# Patient Record
Sex: Female | Born: 1995 | Marital: Single | Smoking: Never smoker
Health system: Southern US, Community
[De-identification: ages and names within clinical notes are randomized; demographics above are authoritative.]

---
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
## 2014-11-04 ENCOUNTER — Emergency Department (HOSPITAL_COMMUNITY)
Admission: EM | Admit: 2014-11-04 | Discharge: 2014-11-04 | Disposition: A | Discharge status: Home / Self Care | Payer: BLUE CROSS/BLUE SHIELD | Attending: Emergency Medicine | Admitting: Emergency Medicine

## 2014-11-04 ENCOUNTER — Encounter (HOSPITAL_COMMUNITY): Payer: Self-pay | Admitting: *Deleted

## 2014-11-04 DIAGNOSIS — Y9289 Other specified places as the place of occurrence of the external cause: Secondary | ICD-10-CM | POA: Diagnosis not present

## 2014-11-04 DIAGNOSIS — Y998 Other external cause status: Secondary | ICD-10-CM | POA: Diagnosis not present

## 2014-11-04 DIAGNOSIS — Y9389 Activity, other specified: Secondary | ICD-10-CM | POA: Insufficient documentation

## 2014-11-04 DIAGNOSIS — W19XXXA Unspecified fall, initial encounter: Secondary | ICD-10-CM

## 2014-11-04 DIAGNOSIS — S01112A Laceration without foreign body of left eyelid and periocular area, initial encounter: Secondary | ICD-10-CM | POA: Diagnosis not present

## 2014-11-04 DIAGNOSIS — W108XXA Fall (on) (from) other stairs and steps, initial encounter: Secondary | ICD-10-CM | POA: Diagnosis not present

## 2014-11-04 DIAGNOSIS — IMO0002 Reserved for concepts with insufficient information to code with codable children: Secondary | ICD-10-CM

## 2014-11-04 MED ORDER — LIDOCAINE HCL (PF) 1 % IJ SOLN
5.0000 mL | Freq: Once | INTRAMUSCULAR | Status: AC
  Administered 2014-11-04: 5 mL via INTRADERMAL
  Filled 2014-11-04: qty 5

## 2014-11-04 NOTE — Discharge Instructions (Signed)
Facial Laceration  A facial laceration is a cut on the face. These injuries can be painful and cause bleeding. Lacerations usually heal quickly, but they need special care to reduce scarring. DIAGNOSIS  Your health care provider will take a medical history, ask for details about how the injury occurred, and examine the wound to determine how deep the cut is. TREATMENT  Some facial lacerations may not require closure. Others may not be able to be closed because of an increased risk of infection. The risk of infection and the chance for successful closure will depend on various factors, including the amount of time since the injury occurred. The wound may be cleaned to help prevent infection. If closure is appropriate, pain medicines may be given if needed. Your health care provider will use stitches (sutures), wound glue (adhesive), or skin adhesive strips to repair the laceration. These tools bring the skin edges together to allow for faster healing and a better cosmetic outcome. If needed, you may also be given a tetanus shot. HOME CARE INSTRUCTIONS  Only take over-the-counter or prescription medicines as directed by your health care provider.  Follow your health care provider's instructions for wound care. These instructions will vary depending on the technique used for closing the wound. For Sutures:  Keep the wound clean and dry.   If you were given a bandage (dressing), you should change it at least once a day. Also change the dressing if it becomes wet or dirty, or as directed by your health care provider.   Wash the wound with soap and water 2 times a day. Rinse the wound off with water to remove all soap. Pat the wound dry with a clean towel.   After cleaning, apply a thin layer of the antibiotic ointment recommended by your health care provider. This will help prevent infection and keep the dressing from sticking.   You may shower as usual after the first 24 hours. Do not soak the  wound in water until the sutures are removed.   Get your sutures removed as directed by your health care provider. With facial lacerations, sutures should usually be taken out after 4-5 days to avoid stitch marks.   Wait a few days after your sutures are removed before applying any makeup. For Skin Adhesive Strips:  Keep the wound clean and dry.   Do not get the skin adhesive strips wet. You may bathe carefully, using caution to keep the wound dry.   If the wound gets wet, pat it dry with a clean towel.   Skin adhesive strips will fall off on their own. You may trim the strips as the wound heals. Do not remove skin adhesive strips that are still stuck to the wound. They will fall off in time.  For Wound Adhesive:  You may briefly wet your wound in the shower or bath. Do not soak or scrub the wound. Do not swim. Avoid periods of heavy sweating until the skin adhesive has fallen off on its own. After showering or bathing, gently pat the wound dry with a clean towel.   Do not apply liquid medicine, cream medicine, ointment medicine, or makeup to your wound while the skin adhesive is in place. This may loosen the film before your wound is healed.   If a dressing is placed over the wound, be careful not to apply tape directly over the skin adhesive. This may cause the adhesive to be pulled off before the wound is healed.   Avoid   prolonged exposure to sunlight or tanning lamps while the skin adhesive is in place.  The skin adhesive will usually remain in place for 5-10 days, then naturally fall off the skin. Do not pick at the adhesive film.  After Healing: Once the wound has healed, cover the wound with sunscreen during the day for 1 full year. This can help minimize scarring. Exposure to ultraviolet light in the first year will darken the scar. It can take 1-2 years for the scar to lose its redness and to heal completely.  SEEK IMMEDIATE MEDICAL CARE IF:  You have redness, pain, or  swelling around the wound.   You see ayellowish-white fluid (pus) coming from the wound.   You have chills or a fever.  MAKE SURE YOU:  Understand these instructions.  Will watch your condition.  Will get help right away if you are not doing well or get worse. Document Released: 07/24/2004 Document Revised: 04/06/2013 Document Reviewed: 01/27/2013 ExitCare Patient Information 2015 [employer]. This information is not intended to replace advice given to you by your health care provider. Make sure you discuss any questions you have with your health care provider.  

## 2014-11-04 NOTE — ED Notes (Signed)
Pt reports tripping and falling down steps today. Denies loc. Has small laceration to left eyebrow, bleeding controlled.

## 2014-11-04 NOTE — ED Provider Notes (Signed)
CSN: [id number]     Arrival date & time 11/04/14  1441 History  This chart was scribed for a non-physician practitioner, Roxy Horsemanobert Tanaysia Bhardwaj, PA-C working with Donnetta HutchingBrian Cook, MD by [place] Peace, ED Scribe. [facility] The patient's care was started at 4:15 PM.    Chief Complaint  Patient presents with  . Fall      Patient is a 19 y.o. female presenting with fall. The history is provided by the patient. No language interpreter was used.  Fall Pertinent negatives include no headaches.    HPI Comments: Selena Padilla is a 19 y.o. female who presents to the Emergency Department complaining of fall that occurred that earlier today where she tripped while wearing heels and fell down 8-10 stairs. Pt hit her head and suffered laceration to her left eyebrow. She goes on to explain that she fell down the first step and then slide down the rest of the way. Bleeding is controlled at this time. No complaints of LOC, dizziness, headaches, or weakness. Pt states she is up to date with Tetanus vaccination.    History reviewed. No pertinent past medical history. History reviewed. No pertinent past surgical history. History reviewed. No pertinent family history. History  Substance Use Topics  . Smoking status: Not on file  . Smokeless tobacco: Not on file  . Alcohol Use: No   OB History    No data available     Review of Systems  Skin: Positive for wound.       Laceration to left eyebrow  Neurological: Negative for dizziness, syncope, weakness and headaches.      Allergies  Review of patient's allergies indicates no known allergies.  Home Medications   Prior to Admission medications   Not on File   BP 112/75 mmHg  Pulse 71  Temp(Src) 98.5 F (36.9 C) (Oral)  Resp 18  Ht 5\' 2"  (1.575 m)  Wt 125 lb (56.7 kg)  BMI 22.86 kg/m2  SpO2 100% Physical Exam  Constitutional: She is oriented to person, place, and time. She appears well-developed and well-nourished. No  distress.  HENT:  Head: Normocephalic and atraumatic.  1 cm laceration over left eyebrow, bleeding controlled, no foreign body  Eyes: Conjunctivae and EOM are normal.  Neck: Neck supple. No tracheal deviation present.  Cardiovascular: Normal rate.   Pulmonary/Chest: Effort normal. No respiratory distress.  Musculoskeletal: Normal range of motion.  Moves all extremities, no bony abnormality, deformity, or tenderness of upper or lower extremities, ambulates appropriately, no CTL spine tenderness, step-off, or deformity  Neurological: She is alert and oriented to person, place, and time.  Skin: Skin is warm and dry.  Psychiatric: She has a normal mood and affect. Her behavior is normal.  Nursing note and vitals reviewed.   ED Course  Procedures (including critical care time) Labs Review Labs Reviewed - No data to display  Imaging Review No results found.   EKG Interpretation None     Medications - No data to display LACERATION REPAIR Performed by: Roxy HorsemanBROWNING, Keymon Mcelroy Authorized by: Roxy HorsemanBROWNING, Makell Cyr Consent: Verbal consent obtained. Risks and benefits: risks, benefits and alternatives were discussed Consent given by: patient Patient identity confirmed: provided demographic data Prepped and Draped in normal sterile fashion Wound explored  Laceration Location: Left Eyebrow  Laceration Length: 1 cm  No Foreign Bodies seen or palpated  Anesthesia: local infiltration  Local anesthetic: lidocaine 1 % without epinephrine  Anesthetic total: 1 ml  Irrigation method: syringe Amount of cleaning: standard  Skin closure: 6-0 proline   Number of sutures: 2   Technique: Interrupted   Patient tolerance: Patient tolerated the procedure well with no immediate complications.  4:18 PM- Treatment plan was discussed with patient who verbalizes understanding and agrees.   MDM   Final diagnoses:  Laceration  Fall, initial encounter    Patient with small eyebrow laceration.   Repaired in ED.  Tdap up to date. I personally performed the services described in this documentation, which was scribed in my presence. The recorded information has been reviewed and is accurate.     Roxy Horsemanobert Leshea Jaggers, PA-C 11/04/14 1656  Donnetta HutchingBrian Cook, MD 11/05/14 [phone]

## 2015-03-02 ENCOUNTER — Emergency Department (HOSPITAL_COMMUNITY)
Admission: EM | Admit: 2015-03-02 | Discharge: 2015-03-02 | Disposition: A | Discharge status: Home / Self Care | Payer: BLUE CROSS/BLUE SHIELD | Attending: Emergency Medicine | Admitting: Emergency Medicine

## 2015-03-02 ENCOUNTER — Encounter (HOSPITAL_COMMUNITY): Payer: Self-pay | Admitting: Emergency Medicine

## 2015-03-02 ENCOUNTER — Emergency Department (HOSPITAL_COMMUNITY): Payer: BLUE CROSS/BLUE SHIELD

## 2015-03-02 DIAGNOSIS — R0789 Other chest pain: Secondary | ICD-10-CM

## 2015-03-02 DIAGNOSIS — R079 Chest pain, unspecified: Secondary | ICD-10-CM | POA: Diagnosis present

## 2015-03-02 LAB — CBC WITH DIFFERENTIAL/PLATELET
Basophils Absolute: 0 10*3/uL (ref 0.0–0.1)
Basophils Relative: 0 % (ref 0–1)
EOS ABS: 0 10*3/uL (ref 0.0–0.7)
EOS PCT: 0 % (ref 0–5)
HCT: 43.5 % (ref 36.0–46.0)
Hemoglobin: 14.4 g/dL (ref 12.0–15.0)
LYMPHS ABS: 2.2 10*3/uL (ref 0.7–4.0)
LYMPHS PCT: 22 % (ref 12–46)
MCH: 31 pg (ref 26.0–34.0)
MCHC: 33.1 g/dL (ref 30.0–36.0)
MCV: 93.8 fL (ref 78.0–100.0)
MONO ABS: 1 10*3/uL (ref 0.1–1.0)
MONOS PCT: 10 % (ref 3–12)
Neutro Abs: 6.9 10*3/uL (ref 1.7–7.7)
Neutrophils Relative %: 68 % (ref 43–77)
PLATELETS: 265 10*3/uL (ref 150–400)
RBC: 4.64 MIL/uL (ref 3.87–5.11)
RDW: 12.6 % (ref 11.5–15.5)
WBC: 10 10*3/uL (ref 4.0–10.5)

## 2015-03-02 LAB — BASIC METABOLIC PANEL
Anion gap: 8 (ref 5–15)
BUN: 11 mg/dL (ref 6–20)
CO2: 25 mmol/L (ref 22–32)
CREATININE: 0.79 mg/dL (ref 0.44–1.00)
Calcium: 9.4 mg/dL (ref 8.9–10.3)
Chloride: 106 mmol/L (ref 101–111)
GFR calc Af Amer: >60 mL/min (ref >60)
GLUCOSE: 84 mg/dL (ref 65–99)
Potassium: 4.2 mmol/L (ref 3.5–5.1)
SODIUM: 139 mmol/L (ref 135–145)

## 2015-03-02 LAB — I-STAT TROPONIN, ED: Troponin i, poc: 0 ng/mL (ref 0.00–0.08)

## 2015-03-02 NOTE — ED Provider Notes (Signed)
CSN: [id number]     Arrival date & time 03/02/15  1739 History   First MD Initiated Contact with Patient 03/02/15 1925     Chief Complaint  Patient presents with  . chest soreness      (Consider location/radiation/quality/duration/timing/severity/associated sxs/prior Treatment) HPI   Pt. is a 19 year old female who complains of chest tightness. States she had cramping in the epigastric region followed by chest tightness after getting a tattoo on her left lateral ribs earlier today. States pain began about 20 minutes following the tattoo. Not currently experiencing any chest pain or abdominal pain. Has not taken any medications for pain. Denies N/V/D, change in bowel habits, SOB, or dysuria.  History reviewed. No pertinent past medical history. History reviewed. No pertinent past surgical history. No family history on file. Social History  Substance Use Topics  . Smoking status: Never Smoker   . Smokeless tobacco: None  . Alcohol Use: No   OB History    No data available     Review of Systems  10 Systems reviewed and are negative for acute change except as noted in the HPI.    Allergies  Review of patient's allergies indicates no known allergies.  Home Medications   Prior to Admission medications   Not on File   BP 141/84 mmHg  Pulse 86  Temp(Src) 98.4 F (36.9 C) (Oral)  Resp 18  SpO2 100%  LMP 03/02/2015 Physical Exam  Constitutional: She appears well-developed and well-nourished. No distress.  HENT:  Head: Normocephalic and atraumatic.  Eyes: Pupils are equal, round, and reactive to light.  Neck: Normal range of motion. Neck supple.  Cardiovascular: Normal rate and regular rhythm.   Pulmonary/Chest: Effort normal and breath sounds normal. She has no wheezes. She has no rhonchi.    Abdominal: Soft.  Neurological: She is alert.  Skin: Skin is warm and dry.  Nursing note and vitals reviewed.   ED Course  Procedures (including critical care time) Labs  Review Labs Reviewed  CBC WITH DIFFERENTIAL/PLATELET  BASIC METABOLIC PANEL  Rosezena Sensor, ED    Imaging Review Dg Chest 2 View  03/02/2015   CLINICAL DATA:  Acute left chest pain today.  EXAM: CHEST  2 VIEW  COMPARISON:  03/04/2012 radiograph  FINDINGS: The cardiomediastinal silhouette is unremarkable.  There is no evidence of focal airspace disease, pulmonary edema, suspicious pulmonary nodule/mass, pleural effusion, or pneumothorax. No acute bony abnormalities are identified.  IMPRESSION: No active cardiopulmonary disease.   Electronically Signed   By: Harmon Pier M.D.   On: 03/02/2015 19:56   I have personally reviewed and evaluated these images and lab results as part of my medical decision-making.   EKG Interpretation None      MDM   Final diagnoses:  Chest wall pain     Patients labs and chest xray are unremarkable. Normal EKG. Patient started having pain 20 min after tattoo on left ribs. She was required to lay on her side for hours. Pain has since resolved. I believe that the symptoms were most likely related to tattoo inflicted by the trauma of a tattoo. No cardiac risk factors or family hx.  Recommend NSAIDS.  Medications - No data to display  19 y.o.Selena Padilla's evaluation in the Emergency Department is complete. It has been determined that no acute conditions requiring further emergency intervention are present at this time. The patient/guardian have been advised of the diagnosis and plan. We have discussed signs and symptoms that warrant return to  the ED, such as changes or worsening in symptoms.  Vital signs are stable at discharge. Filed Vitals:   03/02/15 1824  BP: 141/84  Pulse: 86  Temp: 98.4 F (36.9 C)  Resp: 18    Patient/guardian has voiced understanding and agreed to follow-up with the PCP or specialist.     Marlon Pel, PA-C 03/02/15 2053  Marlon Pel, PA-C 03/02/15 [id number]  Lyndal Pulley, MD 03/03/15 1459

## 2015-03-02 NOTE — Discharge Instructions (Signed)

## 2015-03-02 NOTE — ED Notes (Signed)
Per pt, states she got a tattoo today under left breast/side-states she experienced chest tightness on her left side 20 minutes after getting tattoo

## 2015-03-05 ENCOUNTER — Encounter (HOSPITAL_COMMUNITY): Payer: Self-pay | Admitting: *Deleted

## 2016-01-30 IMAGING — CR DG CHEST 2V
2 series · 2 of 2 positions shown · non-contrast
Comparison: 03/04/2012 radiograph

CLINICAL DATA: Acute left chest pain today.

EXAM:
CHEST  2 VIEW

[w chest pa]
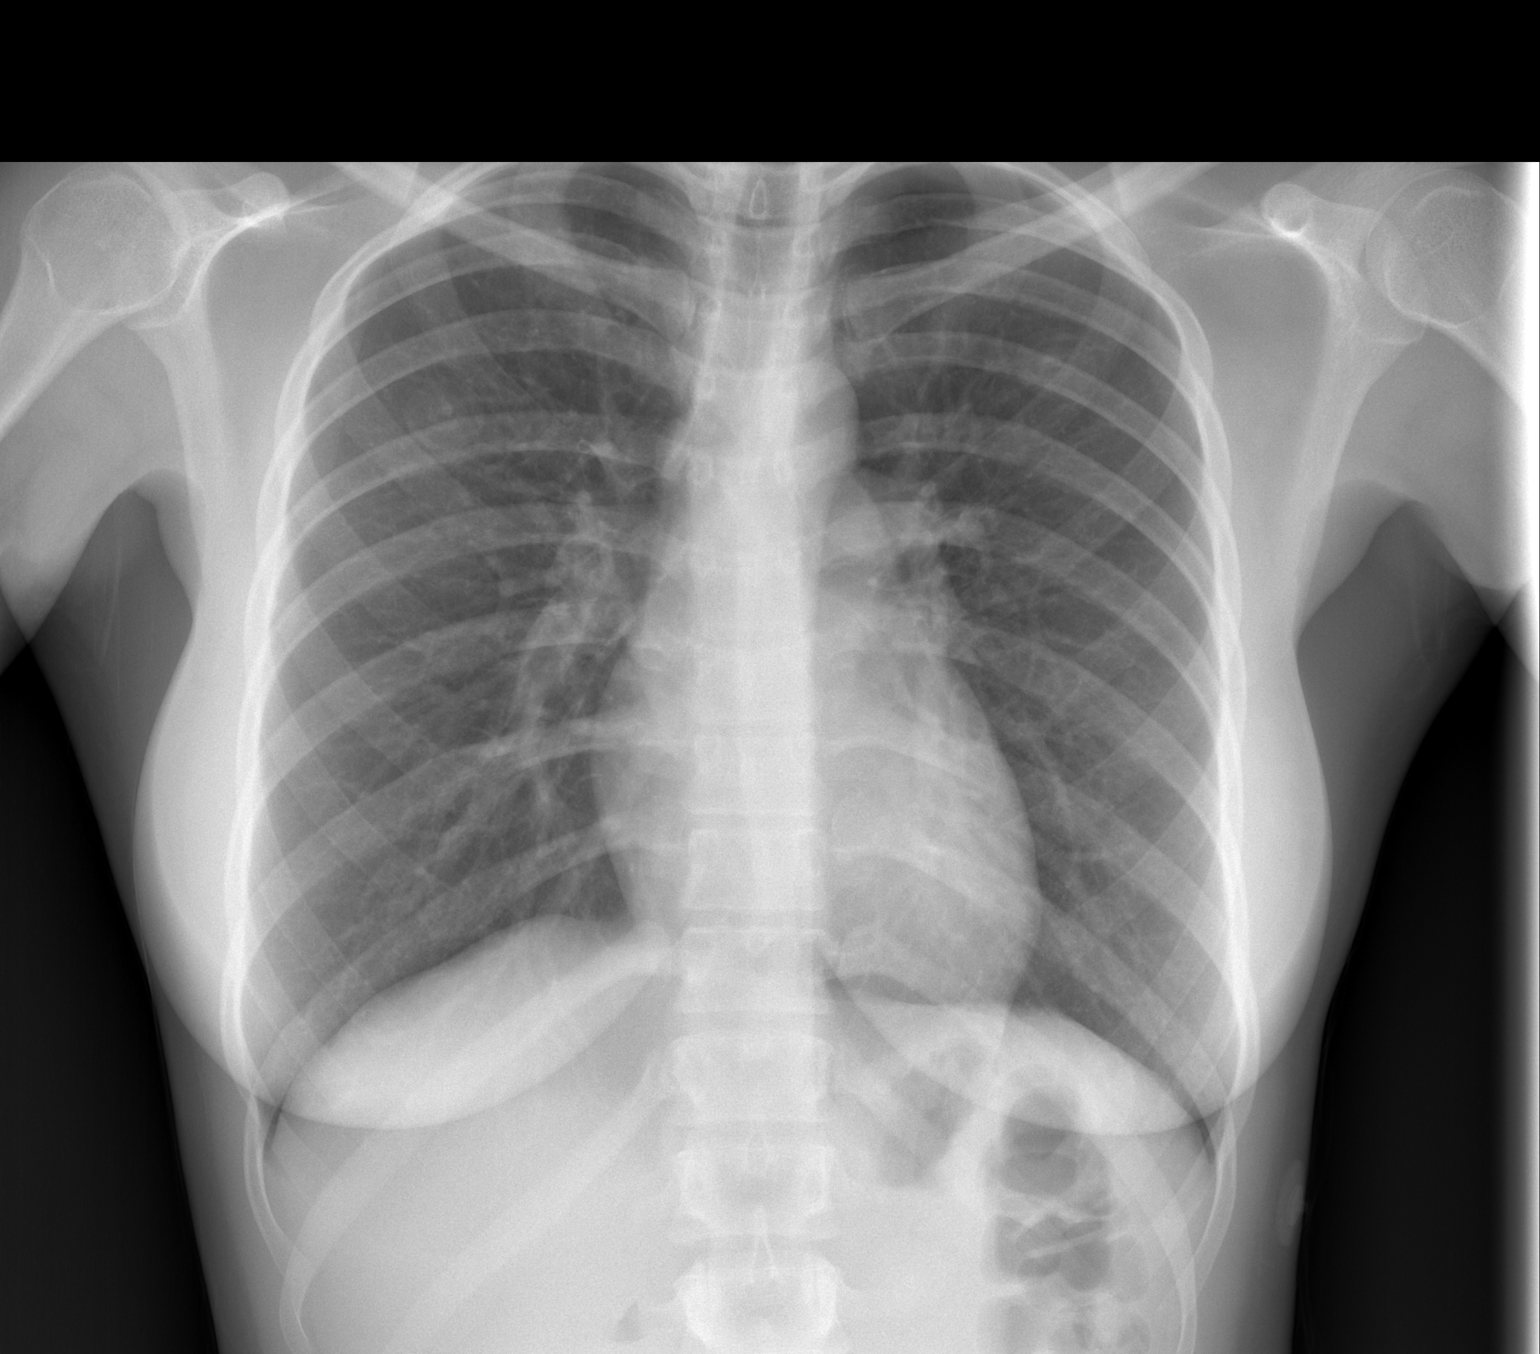

[w chest lat]
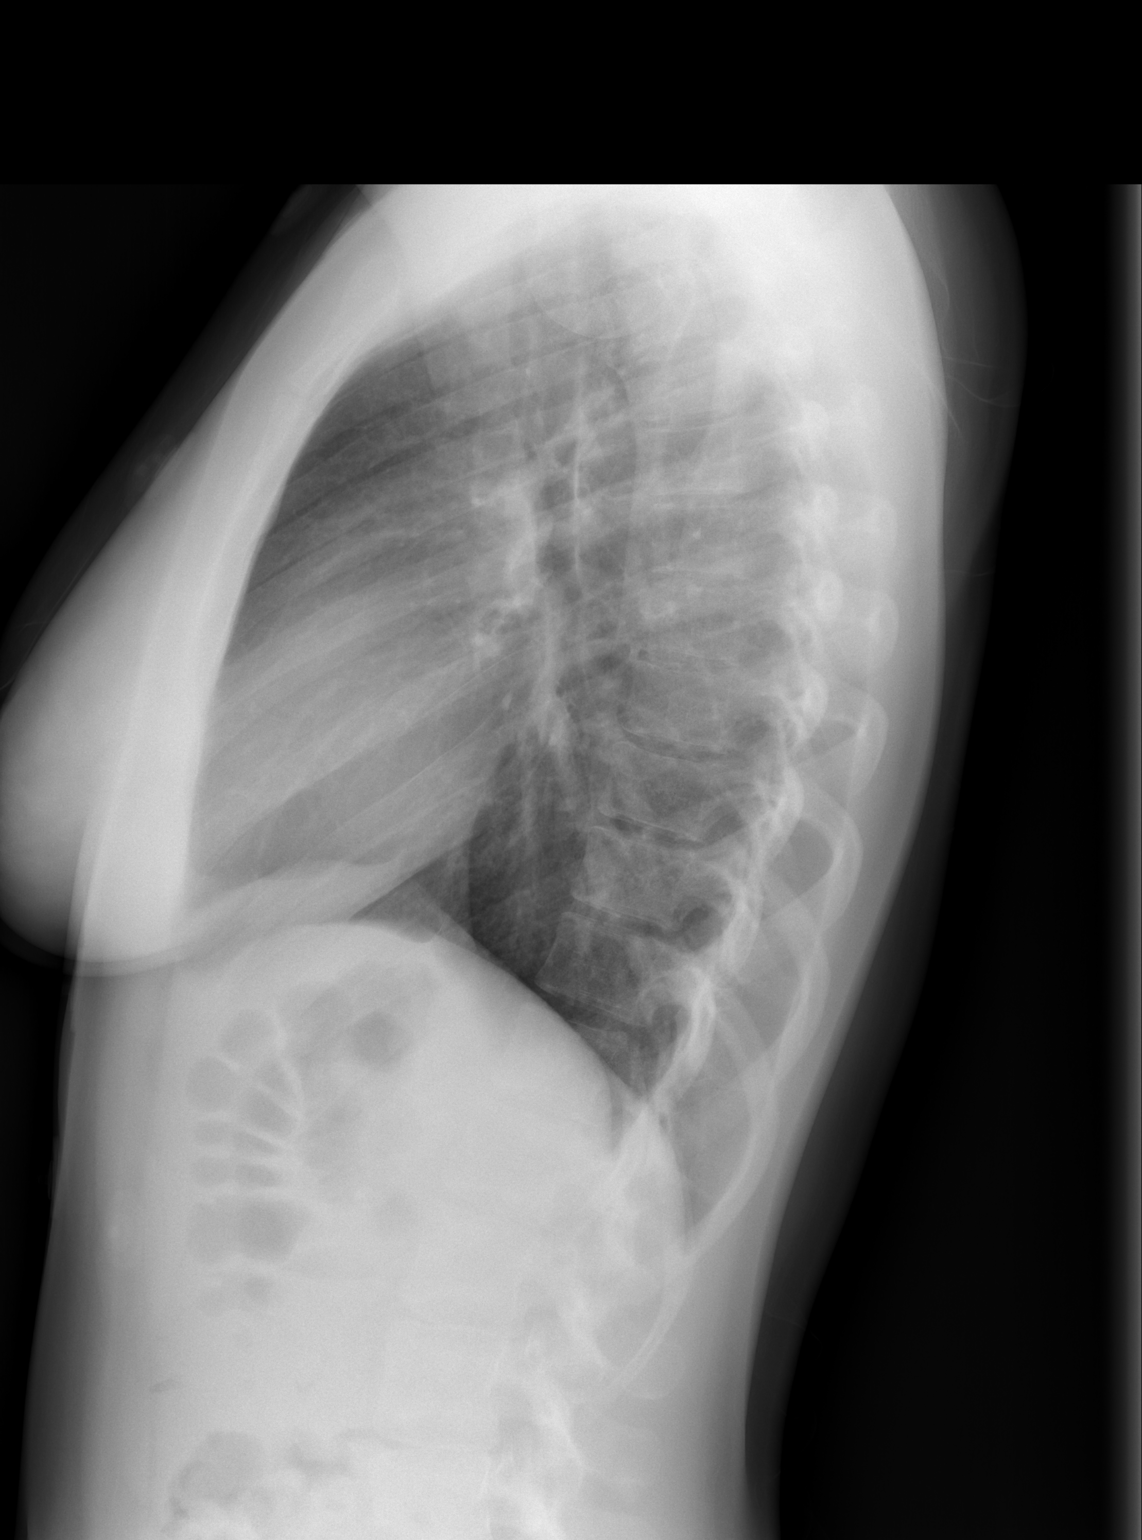

[2 of 2 positions shown; findings below may reference images not displayed]

FINDINGS: The cardiomediastinal silhouette is unremarkable.

There is no evidence of focal airspace disease, pulmonary edema,
suspicious pulmonary nodule/mass, pleural effusion, or pneumothorax.
No acute bony abnormalities are identified.
IMPRESSION: No active cardiopulmonary disease.
